# Patient Record
Sex: Female | Born: 1935 | Race: White | Hispanic: No | Marital: Married | Smoking: Never smoker
Health system: Southern US, Community
[De-identification: ages and names within clinical notes are randomized; demographics above are authoritative.]

---

## 2003-12-23 ENCOUNTER — Ambulatory Visit: Payer: Self-pay | Admitting: Oncology

## 2004-02-18 ENCOUNTER — Other Ambulatory Visit: Payer: Self-pay

## 2004-02-18 ENCOUNTER — Inpatient Hospital Stay: Payer: Self-pay | Admitting: Internal Medicine

## 2004-02-19 ENCOUNTER — Other Ambulatory Visit: Payer: Self-pay

## 2004-03-06 ENCOUNTER — Ambulatory Visit: Payer: Self-pay | Admitting: Oncology

## 2004-03-24 ENCOUNTER — Ambulatory Visit: Payer: Self-pay | Admitting: Oncology

## 2004-06-19 ENCOUNTER — Ambulatory Visit: Payer: Self-pay | Admitting: Oncology

## 2004-06-22 ENCOUNTER — Ambulatory Visit: Payer: Self-pay | Admitting: Oncology

## 2004-07-08 ENCOUNTER — Ambulatory Visit: Payer: Self-pay | Admitting: Internal Medicine

## 2004-09-18 ENCOUNTER — Ambulatory Visit: Payer: Self-pay | Admitting: Oncology

## 2004-09-21 ENCOUNTER — Ambulatory Visit: Payer: Self-pay | Admitting: Oncology

## 2005-01-15 ENCOUNTER — Ambulatory Visit: Payer: Self-pay | Admitting: Oncology

## 2005-01-22 ENCOUNTER — Ambulatory Visit: Payer: Self-pay | Admitting: Oncology

## 2005-05-15 ENCOUNTER — Ambulatory Visit: Payer: Self-pay | Admitting: Oncology

## 2005-05-22 ENCOUNTER — Ambulatory Visit: Payer: Self-pay | Admitting: Oncology

## 2005-09-18 ENCOUNTER — Ambulatory Visit: Payer: Self-pay | Admitting: Oncology

## 2005-09-21 ENCOUNTER — Ambulatory Visit: Payer: Self-pay | Admitting: Oncology

## 2005-10-15 ENCOUNTER — Ambulatory Visit: Payer: Self-pay | Admitting: Internal Medicine

## 2006-01-16 ENCOUNTER — Ambulatory Visit: Payer: Self-pay | Admitting: Oncology

## 2006-01-22 ENCOUNTER — Ambulatory Visit: Payer: Self-pay | Admitting: Oncology

## 2006-03-20 ENCOUNTER — Ambulatory Visit: Payer: Self-pay | Admitting: Oncology

## 2006-03-24 ENCOUNTER — Ambulatory Visit: Payer: Self-pay | Admitting: Oncology

## 2006-06-18 ENCOUNTER — Ambulatory Visit: Payer: Self-pay | Admitting: Oncology

## 2006-06-23 ENCOUNTER — Ambulatory Visit: Payer: Self-pay | Admitting: Oncology

## 2006-09-22 ENCOUNTER — Ambulatory Visit: Payer: Self-pay | Admitting: Oncology

## 2006-10-05 ENCOUNTER — Ambulatory Visit: Payer: Self-pay | Admitting: Internal Medicine

## 2006-10-16 ENCOUNTER — Ambulatory Visit: Payer: Self-pay | Admitting: Oncology

## 2006-10-20 ENCOUNTER — Ambulatory Visit: Payer: Self-pay | Admitting: Internal Medicine

## 2006-10-23 ENCOUNTER — Ambulatory Visit: Payer: Self-pay | Admitting: Oncology

## 2007-01-23 ENCOUNTER — Ambulatory Visit: Payer: Self-pay | Admitting: Oncology

## 2007-01-28 ENCOUNTER — Ambulatory Visit: Payer: Self-pay | Admitting: Oncology

## 2007-02-22 ENCOUNTER — Ambulatory Visit: Payer: Self-pay | Admitting: Oncology

## 2007-05-23 ENCOUNTER — Ambulatory Visit: Payer: Self-pay | Admitting: Oncology

## 2007-05-26 ENCOUNTER — Ambulatory Visit: Payer: Self-pay | Admitting: Oncology

## 2007-06-23 ENCOUNTER — Ambulatory Visit: Payer: Self-pay | Admitting: Oncology

## 2007-08-23 ENCOUNTER — Ambulatory Visit: Payer: Self-pay | Admitting: Oncology

## 2007-08-26 ENCOUNTER — Ambulatory Visit: Payer: Self-pay | Admitting: Oncology

## 2007-09-22 ENCOUNTER — Ambulatory Visit: Payer: Self-pay | Admitting: Oncology

## 2007-09-23 ENCOUNTER — Ambulatory Visit: Payer: Self-pay | Admitting: Oncology

## 2007-09-30 ENCOUNTER — Ambulatory Visit: Payer: Self-pay | Admitting: Internal Medicine

## 2007-10-22 ENCOUNTER — Ambulatory Visit: Payer: Self-pay | Admitting: Internal Medicine

## 2007-10-23 ENCOUNTER — Ambulatory Visit: Payer: Self-pay | Admitting: Oncology

## 2008-02-22 ENCOUNTER — Ambulatory Visit: Payer: Self-pay | Admitting: Oncology

## 2008-03-09 ENCOUNTER — Ambulatory Visit: Payer: Self-pay | Admitting: Oncology

## 2008-03-24 ENCOUNTER — Ambulatory Visit: Payer: Self-pay | Admitting: Oncology

## 2008-06-22 ENCOUNTER — Ambulatory Visit: Payer: Self-pay | Admitting: Oncology

## 2008-07-07 ENCOUNTER — Ambulatory Visit: Payer: Self-pay | Admitting: Oncology

## 2008-07-22 ENCOUNTER — Ambulatory Visit: Payer: Self-pay | Admitting: Oncology

## 2008-10-22 ENCOUNTER — Ambulatory Visit: Payer: Self-pay | Admitting: Oncology

## 2008-11-03 ENCOUNTER — Ambulatory Visit: Payer: Self-pay | Admitting: Oncology

## 2008-11-22 ENCOUNTER — Ambulatory Visit: Payer: Self-pay | Admitting: Oncology

## 2008-12-07 ENCOUNTER — Ambulatory Visit: Payer: Self-pay | Admitting: Internal Medicine

## 2009-02-21 ENCOUNTER — Ambulatory Visit: Payer: Self-pay | Admitting: Oncology

## 2009-03-07 ENCOUNTER — Ambulatory Visit: Payer: Self-pay | Admitting: Oncology

## 2009-03-24 ENCOUNTER — Ambulatory Visit: Payer: Self-pay | Admitting: Oncology

## 2009-08-22 ENCOUNTER — Ambulatory Visit: Payer: Self-pay | Admitting: Oncology

## 2009-09-05 ENCOUNTER — Ambulatory Visit: Payer: Self-pay | Admitting: Oncology

## 2009-09-21 ENCOUNTER — Ambulatory Visit: Payer: Self-pay | Admitting: Oncology

## 2009-11-21 IMAGING — CT CT HEAD WITHOUT CONTRAST
2 series · 16 of 30 positions shown, 20 images · non-contrast
Comparison: none

REASON FOR EXAM: HA   CALL report  [id number]  ext [phone]
COMMENTS:

[Series 2: without · axial · non-contrast · 0.42mm/px · z∈[-143,-8]mm · 13 of 33 slices shown, 17 images]
[im 3/33  brain]
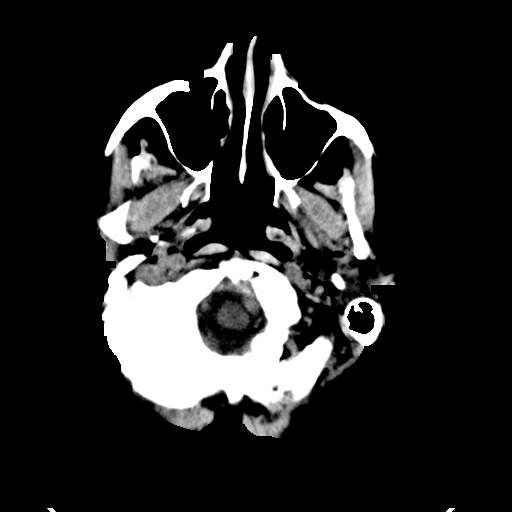
[im 3/33  bone]
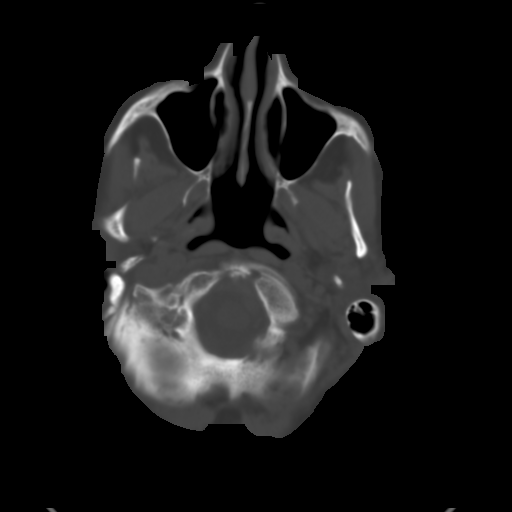
[im 5/33  brain]
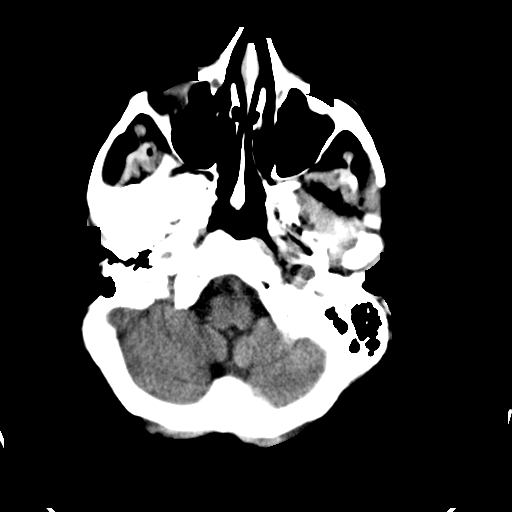
[im 7/33  brain]
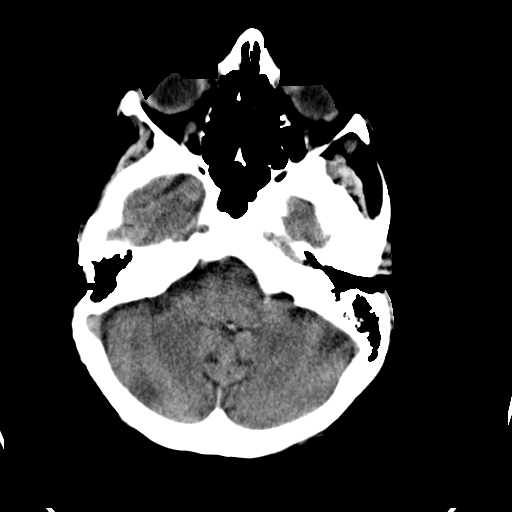
[im 10/33  brain]
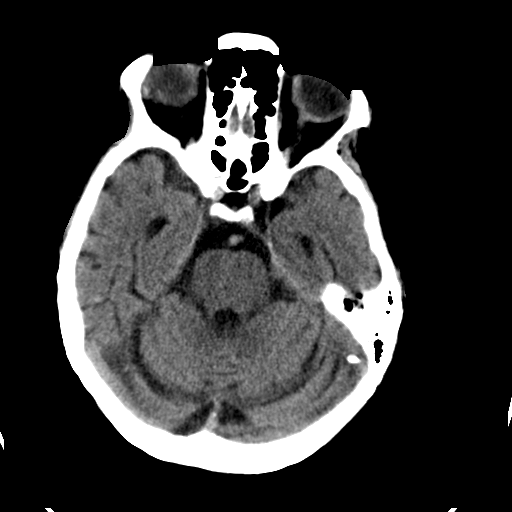
[im 12/33  brain]
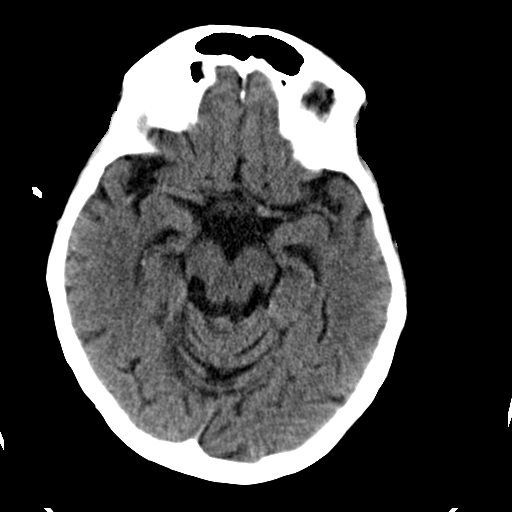
[im 12/33  bone]
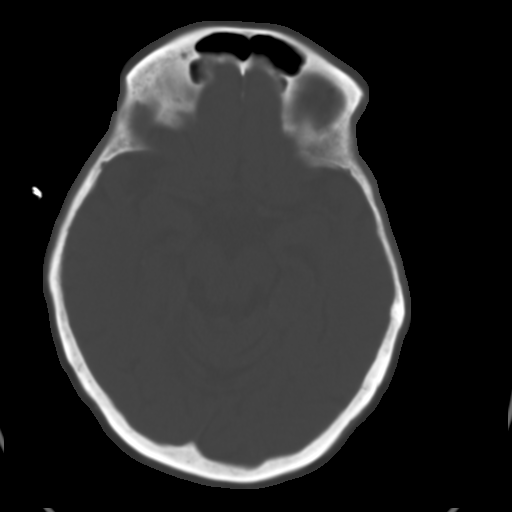
[im 14/33  brain]
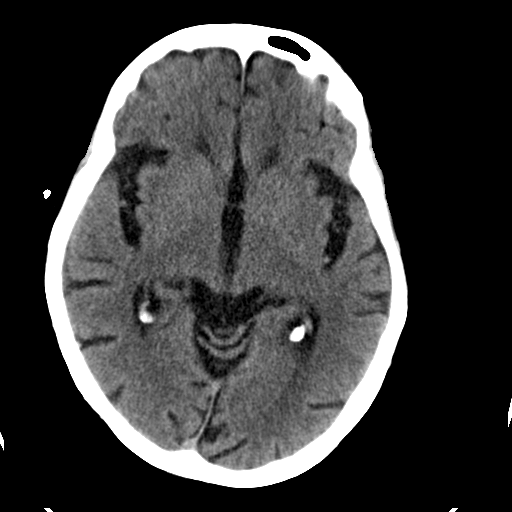
[im 17/33  brain]
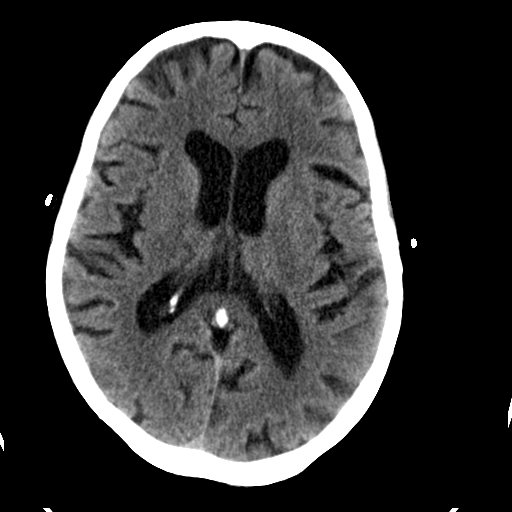
[im 19/33  brain]
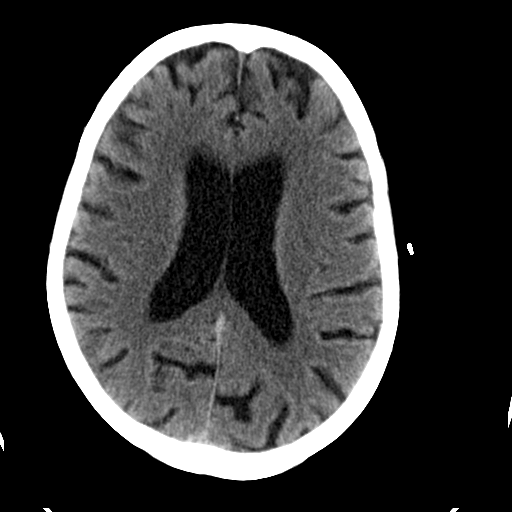
[im 21/33  brain]
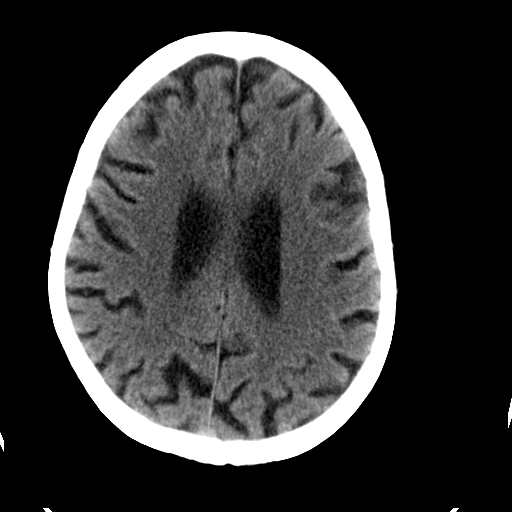
[im 21/33  bone]
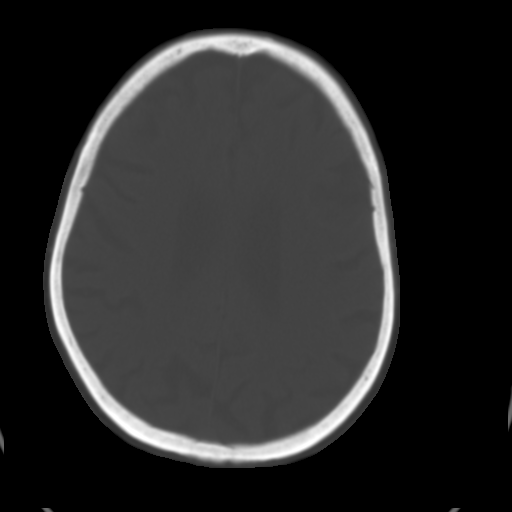
[im 23/33  brain]
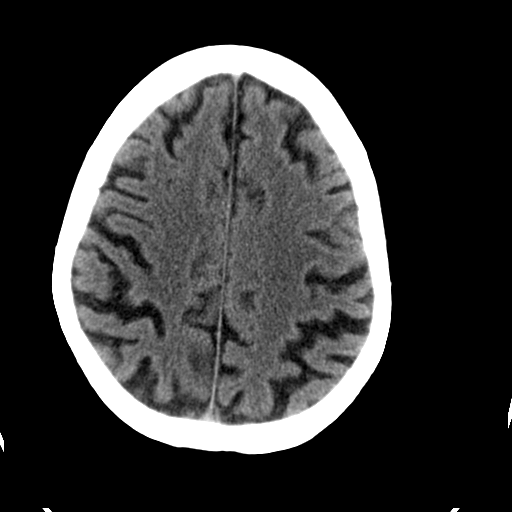
[im 26/33  brain]
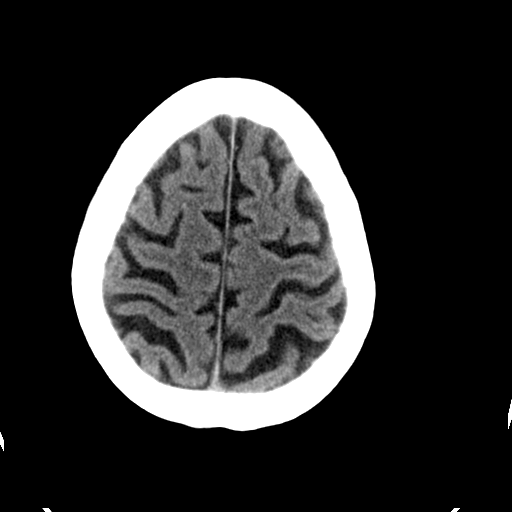
[im 28/33  brain]
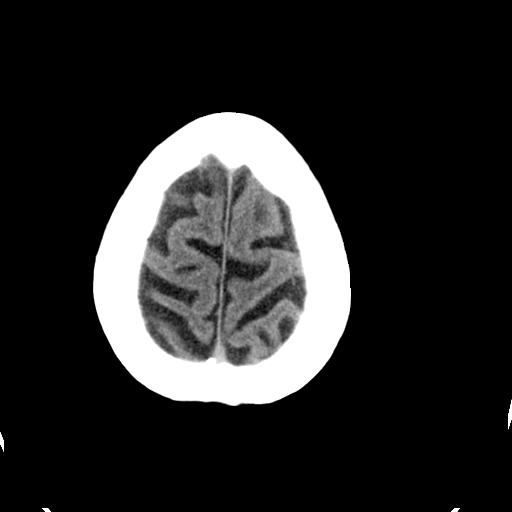
[im 30/33  brain]
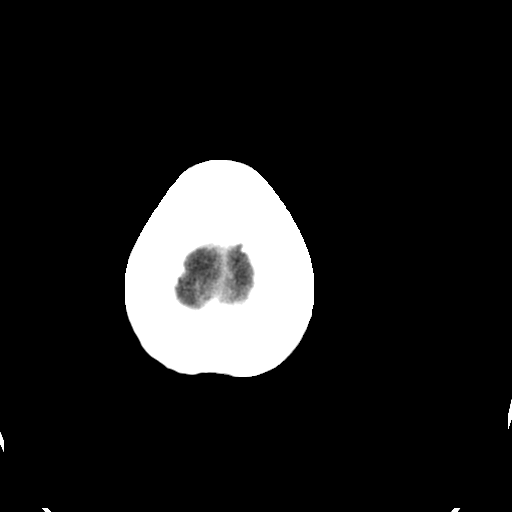
[im 30/33  bone]
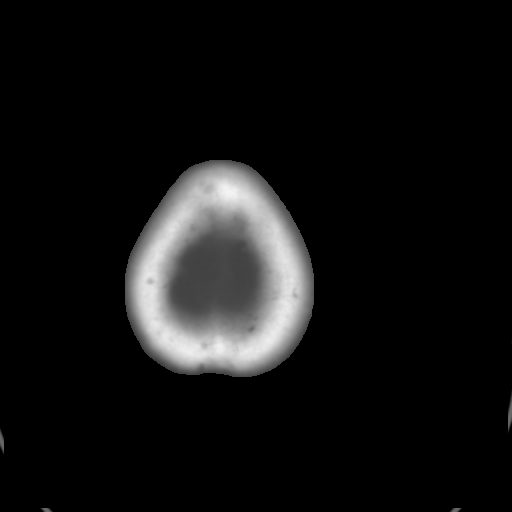

[Series 3: bone · axial · 0.42mm/px · z∈[-143,-98]mm · 3 of 33 slices shown]
[im 3/33  bone]
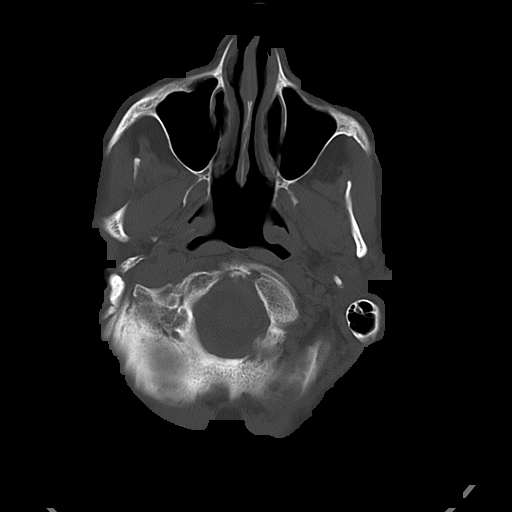
[im 7/33  bone]
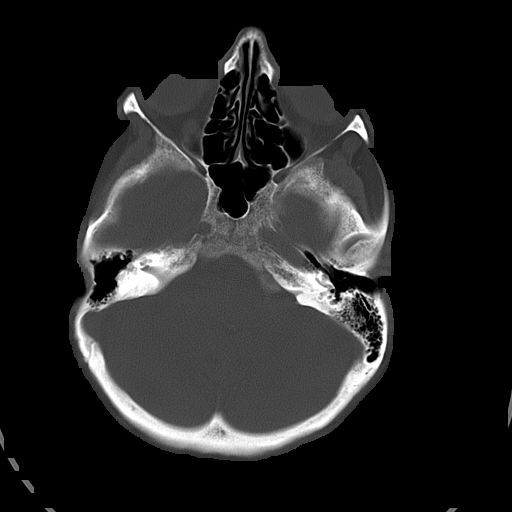
[im 12/33  bone]
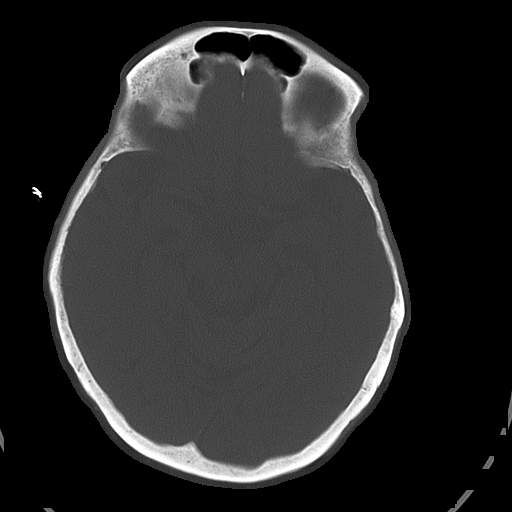

[16 of 30 positions shown; findings below may reference images not displayed]

PROCEDURE:     CT  - CT HEAD WITHOUT CONTRAST  - September 30, 2007 [DATE]

RESULT:     Noncontrast CT of the brain is performed in the standard
fashion. The patient has no previous examination for comparison.

Bone window images demonstrate no evidence of skull fracture. There appears
to be normal aeration of the paranasal sinuses and mastoids. There is
prominence of the ventricles and sulci consistent with diffuse atrophy.
There is ill-defined low attenuation in the periventricular white matter and
subcortical white matter regions. This is nonspecific but is suggestive of
chronic microvascular ischemic disease. There is no gross territorial
infarct. There is no parenchymal or extra-axial hemorrhage or hematoma.
IMPRESSION: 1. Atrophy.
2. Chronic microvascular ischemic disease.
3. No acute intracranial abnormality.

## 2010-03-07 ENCOUNTER — Ambulatory Visit: Payer: Self-pay | Admitting: Oncology

## 2010-03-24 ENCOUNTER — Ambulatory Visit: Payer: Self-pay | Admitting: Oncology

## 2010-09-13 ENCOUNTER — Ambulatory Visit: Payer: Self-pay | Admitting: Oncology

## 2010-09-22 ENCOUNTER — Ambulatory Visit: Payer: Self-pay | Admitting: Oncology

## 2010-09-27 ENCOUNTER — Ambulatory Visit: Payer: Self-pay | Admitting: Internal Medicine

## 2011-03-12 ENCOUNTER — Ambulatory Visit: Payer: Self-pay | Admitting: Oncology

## 2011-03-25 ENCOUNTER — Ambulatory Visit: Payer: Self-pay | Admitting: Oncology

## 2011-09-10 ENCOUNTER — Ambulatory Visit: Payer: Self-pay | Admitting: Oncology

## 2011-09-10 LAB — CBC CANCER CENTER
Basophil #: 0.1 x10 3/mm (ref 0.0–0.1)
Basophil %: 1.3 %
Eosinophil #: 0.3 x10 3/mm (ref 0.0–0.7)
Eosinophil %: 2.4 %
Lymphocyte #: 1.8 x10 3/mm (ref 1.0–3.6)
Lymphocyte %: 16.8 %
MCH: 30.6 pg (ref 26.0–34.0)
MCHC: 33.1 g/dL (ref 32.0–36.0)
MCV: 93 fL (ref 80–100)
Monocyte #: 0.3 x10 3/mm (ref 0.2–0.9)
Neutrophil %: 76.4 %
Platelet: 253 x10 3/mm (ref 150–440)
RDW: 18.2 % — ABNORMAL HIGH (ref 11.5–14.5)
WBC: 10.7 x10 3/mm (ref 3.6–11.0)

## 2011-09-10 LAB — COMPREHENSIVE METABOLIC PANEL
Alkaline Phosphatase: 97 U/L (ref 50–136)
BUN: 32 mg/dL — ABNORMAL HIGH (ref 7–18)
Bilirubin,Total: 0.4 mg/dL (ref 0.2–1.0)
Calcium, Total: 9.7 mg/dL (ref 8.5–10.1)
Creatinine: 1.28 mg/dL (ref 0.60–1.30)
EGFR (African American): 43 — ABNORMAL LOW
EGFR (Non-African Amer.): 37 — ABNORMAL LOW
Glucose: 97 mg/dL (ref 65–99)
Osmolality: 281 (ref 275–301)
Potassium: 4.8 mmol/L (ref 3.5–5.1)
SGOT(AST): 24 U/L (ref 15–37)
Sodium: 137 mmol/L (ref 136–145)
Total Protein: 7.5 g/dL (ref 6.4–8.2)

## 2011-09-22 ENCOUNTER — Ambulatory Visit: Payer: Self-pay | Admitting: Oncology

## 2012-01-19 ENCOUNTER — Emergency Department: Payer: Self-pay | Admitting: Emergency Medicine

## 2012-03-10 ENCOUNTER — Ambulatory Visit: Payer: Self-pay | Admitting: Oncology

## 2012-03-10 LAB — CBC CANCER CENTER
Basophil #: 0.1 x10 3/mm (ref 0.0–0.1)
Eosinophil #: 0.2 x10 3/mm (ref 0.0–0.7)
Eosinophil %: 2.2 %
HCT: 35 % (ref 35.0–47.0)
Lymphocyte #: 2 x10 3/mm (ref 1.0–3.6)
MCH: 31.3 pg (ref 26.0–34.0)
MCV: 90 fL (ref 80–100)
Monocyte %: 3 %
Neutrophil #: 8.2 x10 3/mm — ABNORMAL HIGH (ref 1.4–6.5)
Platelet: 357 x10 3/mm (ref 150–440)
RBC: 3.88 10*6/uL (ref 3.80–5.20)
RDW: 17.7 % — ABNORMAL HIGH (ref 11.5–14.5)
WBC: 10.9 x10 3/mm (ref 3.6–11.0)

## 2012-03-10 LAB — COMPREHENSIVE METABOLIC PANEL
Anion Gap: 7 (ref 7–16)
Calcium, Total: 9.5 mg/dL (ref 8.5–10.1)
Chloride: 104 mmol/L (ref 98–107)
Co2: 29 mmol/L (ref 21–32)
EGFR (African American): 48 — ABNORMAL LOW
EGFR (Non-African Amer.): 42 — ABNORMAL LOW
Glucose: 93 mg/dL (ref 65–99)
Osmolality: 284 (ref 275–301)
Potassium: 4.4 mmol/L (ref 3.5–5.1)
Sodium: 140 mmol/L (ref 136–145)

## 2012-03-24 ENCOUNTER — Ambulatory Visit: Payer: Self-pay | Admitting: Oncology

## 2012-08-22 ENCOUNTER — Ambulatory Visit: Payer: Self-pay | Admitting: Oncology

## 2012-10-08 ENCOUNTER — Ambulatory Visit: Payer: Self-pay | Admitting: Oncology

## 2012-10-11 LAB — COMPREHENSIVE METABOLIC PANEL
Alkaline Phosphatase: 106 U/L (ref 50–136)
BUN: 36 mg/dL — ABNORMAL HIGH (ref 7–18)
Calcium, Total: 9.6 mg/dL (ref 8.5–10.1)
Chloride: 105 mmol/L (ref 98–107)
Co2: 31 mmol/L (ref 21–32)
Creatinine: 1.18 mg/dL (ref 0.60–1.30)
Potassium: 3.7 mmol/L (ref 3.5–5.1)
Total Protein: 7.2 g/dL (ref 6.4–8.2)

## 2012-10-11 LAB — CBC CANCER CENTER
Basophil #: 0.1 x10 3/mm (ref 0.0–0.1)
Eosinophil #: 0.2 x10 3/mm (ref 0.0–0.7)
Eosinophil %: 1.5 %
HGB: 12.7 g/dL (ref 12.0–16.0)
Lymphocyte #: 1.9 x10 3/mm (ref 1.0–3.6)
Lymphocyte %: 16.4 %
MCHC: 34.9 g/dL (ref 32.0–36.0)
MCV: 88 fL (ref 80–100)
Neutrophil %: 78.6 %
Platelet: 280 x10 3/mm (ref 150–440)

## 2012-10-22 ENCOUNTER — Ambulatory Visit: Payer: Self-pay | Admitting: Oncology

## 2013-06-23 ENCOUNTER — Ambulatory Visit: Payer: Self-pay | Admitting: Internal Medicine

## 2014-01-18 DIAGNOSIS — I480 Paroxysmal atrial fibrillation: Secondary | ICD-10-CM | POA: Insufficient documentation

## 2014-01-18 DIAGNOSIS — N183 Chronic kidney disease, stage 3 unspecified: Secondary | ICD-10-CM | POA: Insufficient documentation

## 2014-01-18 DIAGNOSIS — E782 Mixed hyperlipidemia: Secondary | ICD-10-CM | POA: Insufficient documentation

## 2014-03-12 IMAGING — CR PELVIS - 1-2 VIEW
1 series · 1 of 1 positions shown · non-contrast
Comparison: none

REASON FOR EXAM: fall injury
COMMENTS:

[t pelvis ap]
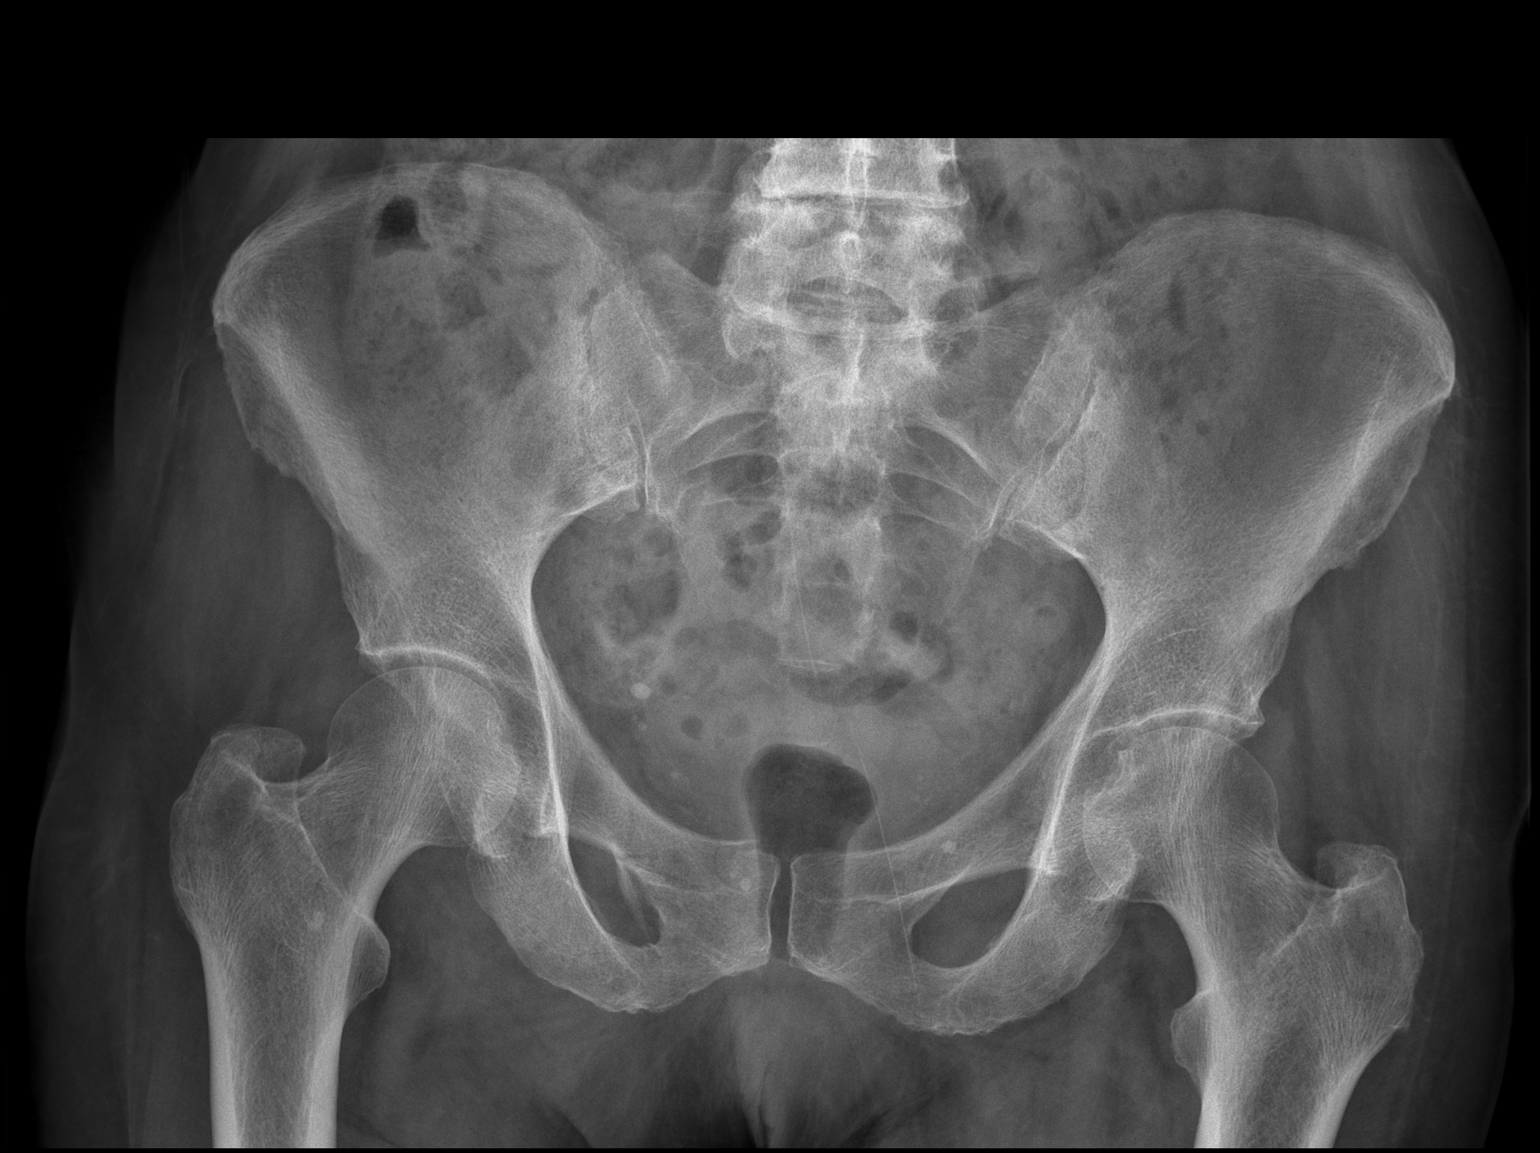

[1 of 1 positions shown; findings below may reference images not displayed]

PROCEDURE:     DXR - DXR PELVIS AP ONLY  - January 19, 2012  [DATE]

RESULT:     Image of the pelvis demonstrates lucency in the superior pubic
ramus on the right consistent with a nondisplaced fracture. Inferior pubic
ramus fractures not definitely identified but likely present. Remaining
pelvis appears to be unremarkable.
IMPRESSION: Right pubic ramus fracture.

[REDACTED]

## 2014-03-27 ENCOUNTER — Ambulatory Visit: Payer: Self-pay | Admitting: Oncology

## 2014-03-27 LAB — CBC CANCER CENTER
BASOS ABS: 0.1 x10 3/mm (ref 0.0–0.1)
Basophil %: 0.7 %
Eosinophil #: 0.2 x10 3/mm (ref 0.0–0.7)
Eosinophil %: 1.2 %
HCT: 39 % (ref 35.0–47.0)
HGB: 12.5 g/dL (ref 12.0–16.0)
LYMPHS PCT: 14.7 %
Lymphocyte #: 2 x10 3/mm (ref 1.0–3.6)
MCH: 29 pg (ref 26.0–34.0)
MCHC: 32.2 g/dL (ref 32.0–36.0)
MCV: 90 fL (ref 80–100)
Monocyte #: 0.4 x10 3/mm (ref 0.2–0.9)
Monocyte %: 2.9 %
Neutrophil #: 11 x10 3/mm — ABNORMAL HIGH (ref 1.4–6.5)
Neutrophil %: 80.5 %
PLATELETS: 373 x10 3/mm (ref 150–440)
RBC: 4.32 10*6/uL (ref 3.80–5.20)
RDW: 18.5 % — AB (ref 11.5–14.5)
WBC: 13.7 x10 3/mm — ABNORMAL HIGH (ref 3.6–11.0)

## 2014-03-27 LAB — COMPREHENSIVE METABOLIC PANEL
ALBUMIN: 3.7 g/dL (ref 3.4–5.0)
ALK PHOS: 111 U/L
ALT: 22 U/L
Anion Gap: 7 (ref 7–16)
BILIRUBIN TOTAL: 0.5 mg/dL (ref 0.2–1.0)
BUN: 36 mg/dL — AB (ref 7–18)
CREATININE: 1.09 mg/dL (ref 0.60–1.30)
Calcium, Total: 9.1 mg/dL (ref 8.5–10.1)
Chloride: 105 mmol/L (ref 98–107)
Co2: 28 mmol/L (ref 21–32)
EGFR (Non-African Amer.): 50 — ABNORMAL LOW
GLUCOSE: 110 mg/dL — AB (ref 65–99)
Osmolality: 288 (ref 275–301)
Potassium: 4.4 mmol/L (ref 3.5–5.1)
SGOT(AST): 19 U/L (ref 15–37)
Sodium: 140 mmol/L (ref 136–145)
TOTAL PROTEIN: 7.5 g/dL (ref 6.4–8.2)

## 2014-04-19 DIAGNOSIS — R270 Ataxia, unspecified: Secondary | ICD-10-CM | POA: Insufficient documentation

## 2014-04-24 ENCOUNTER — Ambulatory Visit: Payer: Self-pay | Admitting: Oncology

## 2014-07-05 DIAGNOSIS — I10 Essential (primary) hypertension: Secondary | ICD-10-CM | POA: Insufficient documentation

## 2014-07-05 DIAGNOSIS — I071 Rheumatic tricuspid insufficiency: Secondary | ICD-10-CM | POA: Insufficient documentation

## 2014-07-05 DIAGNOSIS — I350 Nonrheumatic aortic (valve) stenosis: Secondary | ICD-10-CM | POA: Insufficient documentation

## 2014-07-05 DIAGNOSIS — I340 Nonrheumatic mitral (valve) insufficiency: Secondary | ICD-10-CM | POA: Insufficient documentation

## 2014-07-14 ENCOUNTER — Ambulatory Visit
Admit: 2014-07-14 | Disposition: A | Discharge status: Home / Self Care | Payer: Self-pay | Attending: Internal Medicine | Admitting: Internal Medicine

## 2014-07-26 DIAGNOSIS — R200 Anesthesia of skin: Secondary | ICD-10-CM | POA: Insufficient documentation

## 2014-09-29 ENCOUNTER — Other Ambulatory Visit: Payer: Self-pay | Admitting: *Deleted

## 2014-09-29 DIAGNOSIS — D471 Chronic myeloproliferative disease: Secondary | ICD-10-CM

## 2014-10-02 ENCOUNTER — Inpatient Hospital Stay: Payer: Self-pay | Attending: Oncology

## 2014-10-02 ENCOUNTER — Inpatient Hospital Stay: Payer: Self-pay | Admitting: Oncology

## 2014-10-23 ENCOUNTER — Inpatient Hospital Stay (HOSPITAL_BASED_OUTPATIENT_CLINIC_OR_DEPARTMENT_OTHER): Payer: Medicare Other | Admitting: Oncology

## 2014-10-23 ENCOUNTER — Encounter: Payer: Self-pay | Admitting: Oncology

## 2014-10-23 ENCOUNTER — Inpatient Hospital Stay: Payer: Medicare Other | Attending: Oncology

## 2014-10-23 VITALS — BP 166/76 | HR 65 | Temp 97.0°F | Wt 113.3 lb

## 2014-10-23 DIAGNOSIS — D72829 Elevated white blood cell count, unspecified: Secondary | ICD-10-CM | POA: Diagnosis not present

## 2014-10-23 DIAGNOSIS — D473 Essential (hemorrhagic) thrombocythemia: Secondary | ICD-10-CM | POA: Insufficient documentation

## 2014-10-23 DIAGNOSIS — D471 Chronic myeloproliferative disease: Secondary | ICD-10-CM

## 2014-10-23 DIAGNOSIS — R748 Abnormal levels of other serum enzymes: Secondary | ICD-10-CM

## 2014-10-23 DIAGNOSIS — I10 Essential (primary) hypertension: Secondary | ICD-10-CM | POA: Diagnosis not present

## 2014-10-23 DIAGNOSIS — R634 Abnormal weight loss: Secondary | ICD-10-CM

## 2014-10-23 DIAGNOSIS — E785 Hyperlipidemia, unspecified: Secondary | ICD-10-CM

## 2014-10-23 DIAGNOSIS — E039 Hypothyroidism, unspecified: Secondary | ICD-10-CM

## 2014-10-23 DIAGNOSIS — Z7901 Long term (current) use of anticoagulants: Secondary | ICD-10-CM | POA: Diagnosis not present

## 2014-10-23 DIAGNOSIS — R928 Other abnormal and inconclusive findings on diagnostic imaging of breast: Secondary | ICD-10-CM | POA: Diagnosis not present

## 2014-10-23 DIAGNOSIS — Z800 Family history of malignant neoplasm of digestive organs: Secondary | ICD-10-CM | POA: Insufficient documentation

## 2014-10-23 DIAGNOSIS — R109 Unspecified abdominal pain: Secondary | ICD-10-CM

## 2014-10-23 DIAGNOSIS — E669 Obesity, unspecified: Secondary | ICD-10-CM

## 2014-10-23 DIAGNOSIS — D75839 Thrombocytosis, unspecified: Secondary | ICD-10-CM

## 2014-10-23 DIAGNOSIS — R531 Weakness: Secondary | ICD-10-CM

## 2014-10-23 DIAGNOSIS — K769 Liver disease, unspecified: Secondary | ICD-10-CM | POA: Diagnosis not present

## 2014-10-23 DIAGNOSIS — Z79899 Other long term (current) drug therapy: Secondary | ICD-10-CM | POA: Diagnosis not present

## 2014-10-23 DIAGNOSIS — R110 Nausea: Secondary | ICD-10-CM

## 2014-10-23 DIAGNOSIS — D509 Iron deficiency anemia, unspecified: Secondary | ICD-10-CM

## 2014-10-23 DIAGNOSIS — I4891 Unspecified atrial fibrillation: Secondary | ICD-10-CM | POA: Insufficient documentation

## 2014-10-23 DIAGNOSIS — R5383 Other fatigue: Secondary | ICD-10-CM

## 2014-10-23 DIAGNOSIS — C946 Myelodysplastic disease, not classified: Secondary | ICD-10-CM | POA: Insufficient documentation

## 2014-10-23 DIAGNOSIS — I099 Rheumatic heart disease, unspecified: Secondary | ICD-10-CM | POA: Insufficient documentation

## 2014-10-23 LAB — CBC WITH DIFFERENTIAL/PLATELET
Basophils Absolute: 0.2 10*3/uL — ABNORMAL HIGH (ref 0–0.1)
Basophils Relative: 1 %
EOS ABS: 0.2 10*3/uL (ref 0–0.7)
Eosinophils Relative: 1 %
HCT: 36.3 % (ref 35.0–47.0)
Hemoglobin: 12 g/dL (ref 12.0–16.0)
LYMPHS ABS: 2.3 10*3/uL (ref 1.0–3.6)
LYMPHS PCT: 18 %
MCH: 29.3 pg (ref 26.0–34.0)
MCHC: 33 g/dL (ref 32.0–36.0)
MCV: 88.6 fL (ref 80.0–100.0)
MONO ABS: 0.3 10*3/uL (ref 0.2–0.9)
Monocytes Relative: 3 %
NEUTROS ABS: 10 10*3/uL — AB (ref 1.4–6.5)
NEUTROS PCT: 77 %
PLATELETS: 355 10*3/uL (ref 150–440)
RBC: 4.1 MIL/uL (ref 3.80–5.20)
RDW: 18.2 % — ABNORMAL HIGH (ref 11.5–14.5)
WBC: 13 10*3/uL — AB (ref 3.6–11.0)

## 2014-10-23 LAB — COMPREHENSIVE METABOLIC PANEL
ALK PHOS: 88 U/L (ref 38–126)
ALT: 14 U/L (ref 14–54)
AST: 19 U/L (ref 15–41)
Albumin: 4.1 g/dL (ref 3.5–5.0)
Anion gap: 4 — ABNORMAL LOW (ref 5–15)
BUN: 47 mg/dL — ABNORMAL HIGH (ref 6–20)
CO2: 25 mmol/L (ref 22–32)
Calcium: 9.2 mg/dL (ref 8.9–10.3)
Chloride: 108 mmol/L (ref 101–111)
Creatinine, Ser: 1.15 mg/dL — ABNORMAL HIGH (ref 0.44–1.00)
GFR calc Af Amer: 47 mL/min — ABNORMAL LOW (ref >60)
GFR calc non Af Amer: 40 mL/min — ABNORMAL LOW (ref >60)
Glucose, Bld: 93 mg/dL (ref 65–99)
POTASSIUM: 4 mmol/L (ref 3.5–5.1)
SODIUM: 137 mmol/L (ref 135–145)
Total Bilirubin: 0.6 mg/dL (ref 0.3–1.2)
Total Protein: 7.1 g/dL (ref 6.5–8.1)

## 2014-10-23 NOTE — Progress Notes (Signed)
Patient does have living will. Never smoked. 

## 2014-10-24 ENCOUNTER — Encounter: Payer: Self-pay | Admitting: Oncology

## 2014-10-24 NOTE — Progress Notes (Signed)
[place] @ [facility] [phone]  [phone] Ashley: 21-Jul-1920  MR#: [id number]  ZTI#:[id number]  Patient Care Team: Tracie Harrier, MD as PCP - General (Internal Medicine)  CHIEF COMPLAINT:  Chief Complaint  Patient presents with  . Follow-up   Essential thrombocythemia with myeloproliferative disease on Agrylin Atrial fibrillation on chronic  anticoagulation therapy with Coumadin Aortic valvular disease   INTERVAL HISTORY: 81 year old lady came today further follow-up regarding essential thrombocythemia.  Patient is having increasing difficulty maintaining balance.  Also having problems managing Coumadin. No abdominal pain.  No bleeding.  Getting regular mammogram done.  REVIEW OF SYSTEMS:    general status: Patient is feeling weak and tired.  No change in a performance status.  No chills.  No fever. HEENT:   No evidence of stomatitis Lungs: No cough or shortness of breath Cardiac: No chest pain or paroxysmal nocturnal dyspnea, History of atrial fibrillation . GI: No nausea no vomiting no diarrhea no abdominal pain Skin: No rash Lower extremity no swelling Neurological system: No tingling.  No numbness.  No other focal signs Musculoskeletal system no bony pains  As per HPI. Otherwise, a complete review of systems is negatve.  Smoking History: Smoking History Never Smoked.  PFSH: Additional Past Medical and Surgical History: Past Medical History:   Hypertension.  Rheumatic heart disease.  Hemorrhoids.  Abnormal mammogram, being followed by Dr. Sharlet Salina.    Past Surgical History:   Hysterectomy.  Appendectomy.  Sinus surgery.  Left ovary removed.    Family History:   Mother had stomach cancer. History of liver cancer and prostate cancer in the family.     Social History:   Does not smoke. Has never smoked. Does not drink alcohol.   ADVANCED DIRECTIVES:  Patient does have advance healthcare directive, Patient   does  not desire to make any changes HEALTH MAINTENANCE: History  Substance Use Topics  . Smoking status: Never Smoker   . Smokeless tobacco: Not on file  . Alcohol Use: Not on file      Allergies  Allergen Reactions  . Tetanus Toxoid Anaphylaxis  . Codeine Other (See Comments)  . Doxycycline Other (See Comments)  . Erythromycin Other (See Comments)  . Morphine Other (See Comments)    Current Outpatient Prescriptions  Medication Sig Dispense Refill  . allopurinol (ZYLOPRIM) 100 MG tablet TAKE 1 TABLET (100 MG TOTAL)  BY MOUTH ONCE DAILY.    Marland Kitchen atenolol (TENORMIN) 50 MG tablet TAKE ONE TABLET BY MOUTH TWICE DAILY    . diltiazem (CARDIZEM CD) 240 MG 24 hr capsule TAKE ONE CAPSULE BY MOUTH EVERY DAY    . Eflornithine HCl (VANIQA) 13.9 % cream APPLY TO SKIN AS INSTRUCTED    . hydrochlorothiazide (MICROZIDE) 12.5 MG capsule TAKE ONE CAPSULE BY MOUTH EVERY DAY    . levothyroxine (SYNTHROID, LEVOTHROID) 25 MCG tablet TAKE 1 TABLET BY MOUTH ON SATURDAYS AND SUNDAYS ALONG WITH YOUR 50 MCG TABLET.    Marland Kitchen levothyroxine (SYNTHROID, LEVOTHROID) 50 MCG tablet TAKE ONE TABLET BY MOUTH EVERY DAY    . losartan (COZAAR) 100 MG tablet Take by mouth.    . warfarin (COUMADIN) 1 MG tablet TAKE 1 TABLET WITH 2MG AND 5   MG DOSE DAILY ALTERNATING WITH 8MG EVERY  OTHER DAY    . warfarin (COUMADIN) 2 MG tablet TAKE 1 TABLET BY MOUTH ONCE A DAY (TAKE ALONG WITH 6MG TAB EVERY DAY)    . warfarin (COUMADIN) 5 MG tablet TAKE  1 TABLET BY MOUTH ONCE A DAY TAKE ALONG WITH A 2 MG   TAB AND 1 MG  TAB (TOTAL 8MG) MONDAY TO FRIDAY    . anagrelide (AGRYLIN) 0.5 MG capsule Take by mouth.    . B Complex Vitamins (VITAMIN B COMPLEX PO) Take by mouth.    . Cholecalciferol (VITAMIN D-1000 MAX ST) 1000 UNITS tablet Take by mouth.    . colchicine 0.6 MG tablet Take by mouth.    . iron polysaccharides (NIFEREX) 150 MG capsule Take by mouth.    . losartan (COZAAR) 50 MG tablet Take by mouth.    . magnesium oxide (MAG-OX) 400 MG tablet  Take by mouth.    . Psyllium Husk 100 % POWD Take by mouth.    . ranitidine (ZANTAC) 150 MG capsule Take by mouth.     No current facility-administered medications for this visit.    OBJECTIVE:  Filed Vitals:   10/23/14 1546  BP: 166/76  Pulse: 65  Temp: 97 F (36.1 C)     There is no height on file to calculate BMI.    ECOG FS:1 - Symptomatic but completely ambulatory  PHYSICAL EXAM: GENERAL:  Well developed, well nourished, sitting comfortably in the exam room in no acute distress.   ENT:  Oropharynx clear without lesion.  Tongue normal. Mucous membranes moist.  RESPIRATORY:  Clear to auscultation without rales, wheezes or rhonchi. CARDIOVASCULAR:  Irregular heart sounds.  Systolic murmur. BREAST:  Right breast without masses, skin changes or nipple discharge.  Left breast without masses, skin changes or nipple discharge. ABDOMEN:  Soft, non-tender, with active bowel sounds, and no hepatosplenomegaly.  No masses. BACK:  No CVA tenderness.  No tenderness on percussion of the back or rib cage. SKIN:  No rashes, ulcers or lesions. EXTREMITIES: No edema, no skin discoloration or tenderness.  No palpable cords. LYMPH NODES: No palpable cervical, supraclavicular, axillary or inguinal adenopathy  NEUROLOGICAL: Unremarkable. PSYCH:  Appropriate.   LAB RESULTS:  Appointment on 10/23/2014  Component Date Value Ref Range Status  . WBC 10/23/2014 13.0* 3.6 - 11.0 K/uL Final  . RBC 10/23/2014 4.10  3.80 - 5.20 MIL/uL Final  . Hemoglobin 10/23/2014 12.0  12.0 - 16.0 g/dL Final  . HCT 10/23/2014 36.3  35.0 - 47.0 % Final  . MCV 10/23/2014 88.6  80.0 - 100.0 fL Final  . MCH 10/23/2014 29.3  26.0 - 34.0 pg Final  . MCHC 10/23/2014 33.0  32.0 - 36.0 g/dL Final  . RDW 10/23/2014 18.2* 11.5 - 14.5 % Final  . Platelets 10/23/2014 355  150 - 440 K/uL Final  . Neutrophils Relative % 10/23/2014 77   Final  . Neutro Abs 10/23/2014 10.0* 1.4 - 6.5 K/uL Final  . Lymphocytes Relative  10/23/2014 18   Final  . Lymphs Abs 10/23/2014 2.3  1.0 - 3.6 K/uL Final  . Monocytes Relative 10/23/2014 3   Final  . Monocytes Absolute 10/23/2014 0.3  0.2 - 0.9 K/uL Final  . Eosinophils Relative 10/23/2014 1   Final  . Eosinophils Absolute 10/23/2014 0.2  0 - 0.7 K/uL Final  . Basophils Relative 10/23/2014 1   Final  . Basophils Absolute 10/23/2014 0.2* 0 - 0.1 K/uL Final  . Sodium 10/23/2014 137  135 - 145 mmol/L Final  . Potassium 10/23/2014 4.0  3.5 - 5.1 mmol/L Final  . Chloride 10/23/2014 108  101 - 111 mmol/L Final  . CO2 10/23/2014 25  22 - 32 mmol/L Final  . Glucose,  Bld 10/23/2014 93  65 - 99 mg/dL Final  . BUN 10/23/2014 47* 6 - 20 mg/dL Final  . Creatinine, Ser 10/23/2014 1.15* 0.44 - 1.00 mg/dL Final  . Calcium 10/23/2014 9.2  8.9 - 10.3 mg/dL Final  . Total Protein 10/23/2014 7.1  6.5 - 8.1 g/dL Final  . Albumin 10/23/2014 4.1  3.5 - 5.0 g/dL Final  . AST 10/23/2014 19  15 - 41 U/L Final  . ALT 10/23/2014 14  14 - 54 U/L Final  . Alkaline Phosphatase 10/23/2014 88  38 - 126 U/L Final  . Total Bilirubin 10/23/2014 0.6  0.3 - 1.2 mg/dL Final  . GFR calc non Af Amer 10/23/2014 40* >60 mL/min Final  . GFR calc Af Amer 10/23/2014 47* >60 mL/min Final   Comment: (NOTE) The eGFR has been calculated using the CKD EPI equation. This calculation has not been validated in all clinical situations. eGFR's persistently <60 mL/min signify possible Chronic Kidney Disease.   . Anion gap 10/23/2014 4* 5 - 15 Final       ASSESSMENT: Essential thrombocythemia platelet count is 355,000 stable Myeloproliferative disease white count is stable with 13,000 in stable hemoglobin  MEDICAL DECISION MAKING:  All lab data has been reviewed. Continue Agrylin in the same dose  Patient expressed understanding and was in agreement with this plan. She also understands that She can call clinic at any time with any questions, concerns, or complaints.    No matching staging information was  found for the patient.  Forest Gleason, MD   10/24/2014 8:40 AM

## 2015-04-25 ENCOUNTER — Inpatient Hospital Stay: Payer: Medicare Other | Admitting: Oncology

## 2015-04-25 ENCOUNTER — Inpatient Hospital Stay: Payer: Medicare Other

## 2015-04-25 ENCOUNTER — Ambulatory Visit: Payer: Medicare Other | Admitting: Oncology

## 2015-04-25 ENCOUNTER — Other Ambulatory Visit: Payer: Medicare Other

## 2015-05-09 ENCOUNTER — Telehealth: Payer: Self-pay | Admitting: *Deleted

## 2015-05-09 ENCOUNTER — Inpatient Hospital Stay: Payer: Medicare Other | Admitting: Oncology

## 2015-05-09 ENCOUNTER — Inpatient Hospital Stay: Payer: Medicare Other

## 2015-05-09 NOTE — Telephone Encounter (Signed)
Patient wants to cancel appointment today. Patient in [place] and is very ill. Will need to reschedule appointment and have WBC drawn at that appointment. Patient can be reached @ [phone].

## 2015-05-09 NOTE — Telephone Encounter (Signed)
Message forwarded to scheduling.

## 2015-07-19 ENCOUNTER — Other Ambulatory Visit: Payer: Self-pay | Admitting: *Deleted

## 2015-07-19 MED ORDER — ANAGRELIDE HCL 0.5 MG PO CAPS: 0.5000 mg | ORAL_CAPSULE | Freq: Two times a day (BID) | ORAL | Status: AC

## 2015-07-19 MED ORDER — ANAGRELIDE HCL 0.5 MG PO CAPS: 0.5000 mg | ORAL_CAPSULE | Freq: Two times a day (BID) | ORAL | Status: DC

## 2015-07-19 NOTE — Addendum Note (Signed)
Addended by: Betti Cruz on: 07/19/2015 11:24 AM   Modules accepted: Orders

## 2015-07-27 ENCOUNTER — Telehealth: Payer: Self-pay | Admitting: *Deleted

## 2015-07-27 NOTE — Telephone Encounter (Signed)
Called to let Dr Oliva Bustard know that Savannah Mathis died 07/25/2022 and to thank you for caring for her
